# Patient Record
Sex: Male | Born: 1970 | Race: Black or African American | Hispanic: No | Marital: Married | Smoking: Current every day smoker
Health system: Southern US, Community
[De-identification: ages and names within clinical notes are randomized; demographics above are authoritative.]

## PROBLEM LIST (undated history)

## (undated) DIAGNOSIS — M1990 Unspecified osteoarthritis, unspecified site: Secondary | ICD-10-CM

## (undated) DIAGNOSIS — I10 Essential (primary) hypertension: Secondary | ICD-10-CM

## (undated) DIAGNOSIS — J45909 Unspecified asthma, uncomplicated: Secondary | ICD-10-CM

---

## 2013-03-07 ENCOUNTER — Encounter (HOSPITAL_COMMUNITY): Payer: Self-pay | Admitting: Emergency Medicine

## 2013-03-07 ENCOUNTER — Emergency Department (HOSPITAL_COMMUNITY)
Admission: EM | Admit: 2013-03-07 | Discharge: 2013-03-07 | Disposition: A | Discharge status: Home / Self Care | Payer: Medicare Other | Attending: Emergency Medicine | Admitting: Emergency Medicine

## 2013-03-07 ENCOUNTER — Emergency Department (HOSPITAL_COMMUNITY): Payer: Medicare Other

## 2013-03-07 DIAGNOSIS — M2391 Unspecified internal derangement of right knee: Secondary | ICD-10-CM

## 2013-03-07 DIAGNOSIS — Z791 Long term (current) use of non-steroidal anti-inflammatories (NSAID): Secondary | ICD-10-CM | POA: Insufficient documentation

## 2013-03-07 DIAGNOSIS — F17200 Nicotine dependence, unspecified, uncomplicated: Secondary | ICD-10-CM | POA: Insufficient documentation

## 2013-03-07 DIAGNOSIS — Z79899 Other long term (current) drug therapy: Secondary | ICD-10-CM | POA: Insufficient documentation

## 2013-03-07 DIAGNOSIS — J45909 Unspecified asthma, uncomplicated: Secondary | ICD-10-CM | POA: Insufficient documentation

## 2013-03-07 DIAGNOSIS — X500XXA Overexertion from strenuous movement or load, initial encounter: Secondary | ICD-10-CM | POA: Insufficient documentation

## 2013-03-07 DIAGNOSIS — Y929 Unspecified place or not applicable: Secondary | ICD-10-CM | POA: Insufficient documentation

## 2013-03-07 DIAGNOSIS — Y9301 Activity, walking, marching and hiking: Secondary | ICD-10-CM | POA: Insufficient documentation

## 2013-03-07 DIAGNOSIS — M1990 Unspecified osteoarthritis, unspecified site: Secondary | ICD-10-CM | POA: Insufficient documentation

## 2013-03-07 DIAGNOSIS — IMO0002 Reserved for concepts with insufficient information to code with codable children: Secondary | ICD-10-CM | POA: Insufficient documentation

## 2013-03-07 DIAGNOSIS — W108XXA Fall (on) (from) other stairs and steps, initial encounter: Secondary | ICD-10-CM | POA: Insufficient documentation

## 2013-03-07 HISTORY — DX: Unspecified osteoarthritis, unspecified site: M19.90

## 2013-03-07 HISTORY — DX: Unspecified asthma, uncomplicated: J45.909

## 2013-03-07 MED ORDER — OXYCODONE-ACETAMINOPHEN 5-325 MG PO TABS
2.0000 | ORAL_TABLET | Freq: Once | ORAL | Status: AC
  Administered 2013-03-07: 2 via ORAL
  Filled 2013-03-07: qty 2

## 2013-03-07 NOTE — ED Notes (Signed)
Pt given water 

## 2013-03-07 NOTE — ED Notes (Signed)
Patient transported to X-ray 

## 2013-03-07 NOTE — Discharge Instructions (Signed)

## 2013-03-07 NOTE — ED Notes (Signed)
Presents with right knee pain began after walking down stairs and hearing a POP, then the knee gave out. CMS intact. Pain rated 10/10

## 2013-03-07 NOTE — ED Provider Notes (Signed)
CSN: [id number]     Arrival date & time 03/07/13  2140 History  This chart was scribed for Bryan Helper, PA working with Shanna Cisco, MD by Quintella Reichert, ED Scribe. This patient was seen in room TR11C/TR11C and the patient's care was started at 11:01 PM.   Chief Complaint  Patient presents with  . Knee Pain    The history is provided by the patient. No language interpreter was used.    HPI Comments: Bryan Jordan is a 43 y.o. male with h/o degenerative joint disease who presents to the Emergency Department complaining of sudden-onset right knee pain that began 6.5 hours ago.  Pt states he had a meniscus tear surgery on that knee last year in [place] and has had some intermittent pain and "locking up" since then.  Today while he was walking down the stairs he had an episode of his knee locking up and he fell.  In the process of falling he heard a "pop."  He denies head impact or LOC.  He states that he immediately developed severe pain to the anterolateral aspect of the right knee with associated visible swelling.  He has not been able to bear any weight since then.  Pain is worsened by flexion.  Pt takes Percocet and meloxicam daily which are prescribed by his pain management specialist.  He took these today as usual but did not take anything extra for his pain today.  He denies ankle or hip pain.    Past Medical History  Diagnosis Date  . DJD (degenerative joint disease)   . Asthma     History reviewed. No pertinent past surgical history.  History reviewed. No pertinent family history.   History  Substance Use Topics  . Smoking status: Current Every Day Smoker    Types: Cigarettes  . Smokeless tobacco: Not on file  . Alcohol Use: Yes     Review of Systems  Musculoskeletal: Positive for arthralgias (Right knee) and joint swelling (Right knee).  Neurological: Negative for syncope.     Allergies  Review of patient's allergies indicates no known allergies.  Home  Medications   Current Outpatient Rx  Name  Route  Sig  Dispense  Refill  . albuterol (PROVENTIL HFA;VENTOLIN HFA) 108 (90 BASE) MCG/ACT inhaler   Inhalation   Inhale 2 puffs into the lungs every 6 (six) hours as needed for wheezing or shortness of breath.         Marland Kitchen albuterol (PROVENTIL) (2.5 MG/3ML) 0.083% nebulizer solution   Nebulization   Take 2.5 mg by nebulization every 6 (six) hours as needed for wheezing or shortness of breath.         . EPINEPHrine (EPIPEN 2-PAK) 0.3 mg/0.3 mL SOAJ injection   Intramuscular   Inject 0.3 mg into the muscle once.         . meloxicam (MOBIC) 15 MG tablet   Oral   Take 15 mg by mouth daily.         Marland Kitchen oxyCODONE-acetaminophen (PERCOCET/ROXICET) 5-325 MG per tablet   Oral   Take 1 tablet by mouth every 4 (four) hours as needed for moderate pain or severe pain.          BP 121/75  Pulse 95  Temp(Src) 98.4 F (36.9 C) (Oral)  Resp 18  SpO2 97%  Physical Exam  Nursing note and vitals reviewed. Constitutional: He is oriented to person, place, and time. He appears well-developed and well-nourished. No distress.  HENT:  Head:  Normocephalic and atraumatic.  Eyes: EOM are normal.  Neck: Neck supple. No tracheal deviation present.  Cardiovascular: Normal rate.   Pulmonary/Chest: Effort normal. No respiratory distress.  Musculoskeletal: He exhibits tenderness.  Moderate tenderness to anterolateral aspect of right knee, with moderate effusion noted, specifically to the suprapatellar region.  No obvious deformity noted.  Sensation intact.  Decreased ROM secondary to pain, especially with knee flexion.  Intact distal pulses.  Lower leg without palpable cords, erythema or edema.  Right hip and ankle normal.  Neurological: He is alert and oriented to person, place, and time.  Skin: Skin is warm and dry.  Psychiatric: He has a normal mood and affect. His behavior is normal.    ED Course  Procedures (including critical care  time)  DIAGNOSTIC STUDIES: Oxygen Saturation is 97% on room air, normal by my interpretation.    COORDINATION OF CARE: 11:07 PM-Discussed treatment plan which includes knee sleeve, crutches, continuing regular medication regimen, and orthopedic f/u with pt at bedside and pt agreed to plan.    Labs Review Labs Reviewed - No data to display  Imaging Review Dg Knee Complete 4 Views Right  03/07/2013   CLINICAL DATA:  Right knee pain.  Fall.  EXAM: RIGHT KNEE - COMPLETE 4+ VIEW  COMPARISON:  None.  FINDINGS: Moderate joint effusion is identified. No fracture or dislocation is seen. Small medial compartment osteophytes are noted.  IMPRESSION: Negative for fracture.  Moderate joint effusion.  Mild medial compartment degenerative change.   Electronically Signed   By: Drusilla Kannerhomas  Dalessio M.D.   On: 03/07/2013 22:23    EKG Interpretation   None       MDM   1. Acute internal derangement of right knee    BP 121/75  Pulse 95  Temp(Src) 98.4 F (36.9 C) (Oral)  Resp 18  SpO2 97%  I have reviewed nursing notes and vital signs. I personally reviewed the imaging tests through PACS system  I reviewed available ER/hospitalization records thought the EMR   I personally performed the services described in this documentation, which was scribed in my presence. The recorded information has been reviewed and is accurate.     Bryan HelperBowie Mirielle Byrum, PA-C 03/08/13 0130

## 2013-03-07 NOTE — ED Notes (Signed)
Ortho paged. 

## 2013-03-10 NOTE — ED Provider Notes (Signed)
Medical screening examination/treatment/procedure(s) were performed by non-physician practitioner and as supervising physician I was immediately available for consultation/collaboration.  EKG Interpretation   None         Shanna CiscoMegan E Docherty, MD 03/10/13 [phone]

## 2016-02-25 ENCOUNTER — Encounter (HOSPITAL_COMMUNITY): Payer: Self-pay | Admitting: Emergency Medicine

## 2016-02-25 ENCOUNTER — Emergency Department (HOSPITAL_COMMUNITY)
Admission: EM | Admit: 2016-02-25 | Discharge: 2016-02-25 | Disposition: A | Discharge status: Home / Self Care | Payer: Medicare Other | Attending: Emergency Medicine | Admitting: Emergency Medicine

## 2016-02-25 DIAGNOSIS — Z79899 Other long term (current) drug therapy: Secondary | ICD-10-CM | POA: Diagnosis not present

## 2016-02-25 DIAGNOSIS — L03211 Cellulitis of face: Secondary | ICD-10-CM | POA: Diagnosis not present

## 2016-02-25 DIAGNOSIS — F17210 Nicotine dependence, cigarettes, uncomplicated: Secondary | ICD-10-CM | POA: Insufficient documentation

## 2016-02-25 DIAGNOSIS — I10 Essential (primary) hypertension: Secondary | ICD-10-CM | POA: Diagnosis not present

## 2016-02-25 DIAGNOSIS — J45909 Unspecified asthma, uncomplicated: Secondary | ICD-10-CM | POA: Insufficient documentation

## 2016-02-25 DIAGNOSIS — R220 Localized swelling, mass and lump, head: Secondary | ICD-10-CM | POA: Diagnosis present

## 2016-02-25 HISTORY — DX: Essential (primary) hypertension: I10

## 2016-02-25 MED ORDER — DOXYCYCLINE HYCLATE 100 MG PO CAPS: 100.0000 mg | ORAL_CAPSULE | Freq: Two times a day (BID) | ORAL | 0 refills | Status: AC

## 2016-02-25 MED ORDER — DOXYCYCLINE HYCLATE 100 MG PO CAPS: 100.0000 mg | ORAL_CAPSULE | Freq: Two times a day (BID) | ORAL | 0 refills | Status: DC

## 2016-02-25 NOTE — ED Provider Notes (Signed)
[facility]-EMERGENCY DEPT Provider Note   CSN: [id number] Arrival date & time: 02/25/16  0808     History   Chief Complaint Chief Complaint  Patient presents with  . Facial Swelling    HPI Bryan Jordan is a 46 y.o. male with PMH HTN and Asthma who presents with right facial swelling which began today.    HPI Patient reports that he was asymptomatic yesterday but noticed swelling and redness on the right side of his nose and skin over right maxillary area. Denies popping pimple around that area. No dental pain. No sinus pressure/pain. No recent illness with URI symptoms. No shortness of breath. No sore throat or trouble swallowing. No recent dental procedures. No fevers at home. Symptoms are the same currently compared to when it started. No history of cellulitis.Reports of allergy to grass s/p immunotherapy.   Past Medical History:  Diagnosis Date  . Asthma   . DJD (degenerative joint disease)   . Hypertension     There are no active problems to display for this patient.   History reviewed. No pertinent surgical history.     Home Medications    Prior to Admission medications   Medication Sig Start Date End Date Taking? Authorizing Provider  ADVAIR DISKUS 100-50 MCG/DOSE AEPB Inhale 1 puff into the lungs daily. 01/29/16  Yes Historical Provider, MD  albuterol (PROVENTIL HFA;VENTOLIN HFA) 108 (90 BASE) MCG/ACT inhaler Inhale 2 puffs into the lungs every 6 (six) hours as needed for wheezing or shortness of breath.   Yes Historical Provider, MD  amLODipine (NORVASC) 10 MG tablet Take 10 mg by mouth daily. 01/29/16  Yes Historical Provider, MD  hydrochlorothiazide (HYDRODIURIL) 25 MG tablet Take 25 mg by mouth daily. 01/29/16  Yes Historical Provider, MD  meloxicam (MOBIC) 15 MG tablet Take 15 mg by mouth daily.   Yes Historical Provider, MD  oxyCODONE-acetaminophen (PERCOCET) 10-325 MG tablet Take 1 tablet by mouth every 6 (six) hours as needed for pain.   Yes Historical  Provider, MD  simvastatin (ZOCOR) 20 MG tablet Take 20 mg by mouth daily. 01/29/16  Yes Historical Provider, MD  albuterol (PROVENTIL) (2.5 MG/3ML) 0.083% nebulizer solution Take 2.5 mg by nebulization every 6 (six) hours as needed for wheezing or shortness of breath.    Historical Provider, MD  doxycycline (VIBRAMYCIN) 100 MG capsule Take 1 capsule (100 mg total) by mouth 2 (two) times daily. 02/25/16   Palma HolterKanishka G Gunadasa, MD  EPINEPHrine (EPIPEN 2-PAK) 0.3 mg/0.3 mL SOAJ injection Inject 0.3 mg into the muscle once.    Historical Provider, MD    Family History No family history on file.  Social History Social History  Substance Use Topics  . Smoking status: Current Every Day Smoker    Types: Cigarettes  . Smokeless tobacco: Never Used  . Alcohol use Yes     Allergies   Patient has no known allergies.   Review of Systems Review of Systems  Constitutional: Negative for chills and fever.  HENT: Positive for facial swelling. Negative for drooling, ear pain, rhinorrhea, sinus pain, sinus pressure, sore throat, trouble swallowing and voice change.   Respiratory: Negative for cough and shortness of breath.      Physical Exam Updated Vital Signs BP (!) 136/106   Pulse 67   Temp 98.9 F (37.2 C) (Oral)   Resp 18   Ht 5\' 6"  (1.676 m)   Wt 76.7 kg   SpO2 98%   BMI 27.28 kg/m   Physical Exam  Constitutional:  He is oriented to person, place, and time. He appears well-developed and well-nourished. No distress.  HENT:  Head: Normocephalic and atraumatic.  Mouth/Throat: Oropharynx is clear and moist. No oropharyngeal exudate.  Swelling of the right nare and of skin on the right maxillary area beside nose with associated mild erythema. Tenderness to palpation of this area. No fluctuance noted. Used bedside ultrasound without a focal fluid collection noted. No pain with palpation of gums. Uvula midline   Cardiovascular: Normal rate and regular rhythm.  Exam reveals no gallop and  no friction rub.   No murmur heard. Pulmonary/Chest: Effort normal and breath sounds normal.  Abdominal: Soft. There is no tenderness.  Neurological: He is alert and oriented to person, place, and time. No cranial nerve deficit.  Skin: Skin is warm and dry. Capillary refill takes less than 2 seconds.   ED Treatments / Results  Labs (all labs ordered are listed, but only abnormal results are displayed) Labs Reviewed - No data to display  EKG  EKG Interpretation None       Radiology No results found.  Procedures Procedures (including critical care time)  Medications Ordered in ED Medications - No data to display   Initial Impression / Assessment and Plan / ED Course  I have reviewed the triage vital signs and the nursing notes.  Pertinent labs & imaging results that were available during my care of the patient were reviewed by me and considered in my medical decision making (see chart for details).  Patient with symptoms consistent with right facial cellulitis on R nose and maxillary area. No orbital or periorbital cellulitis noted on exam.  No fluctuance noted on exam; no focal fluid collection noted on bedside ultrasound. Recommended warm compresses and Doxycycline 100mg  BID x 5 days. Recommended follow up with PCP in about 3 days to ensure symptoms resolving on antibiotic. ED return precautions discussed.   Final Clinical Impressions(s) / ED Diagnoses   Final diagnoses:  Cellulitis of face    New Prescriptions Discharge Medication List as of 02/25/2016 12:33 PM       Palma Holter, MD 02/25/16 1406    Melene Plan, DO 02/25/16 1410

## 2016-02-25 NOTE — Discharge Instructions (Signed)
Your symptoms are likely consistent with cellulitis Please apply warm compresses to the area four times a day. Try not to squeeze the area. Please take the antibiotic as prescribed for 5 days. Please return to the ED if your symptoms worsen and/or if it spreads to your eye. Please follow up with your PCP to ensure your symptoms are improving.

## 2016-02-25 NOTE — ED Triage Notes (Signed)
Facial swelling  Since this am , from nose up to under rt eye , no difficulty breathing , handling secreations well , stataes is on HCTZ but has not taken it in a while

## 2016-02-25 NOTE — ED Notes (Signed)
ED Provider at bedside. 

## 2016-02-25 NOTE — ED Notes (Signed)
Pt reports he is due for 10/325mg  percocets that he takes Q4 for knee arthritis. MD informed

## 2017-05-03 ENCOUNTER — Other Ambulatory Visit: Payer: Self-pay

## 2017-05-03 ENCOUNTER — Encounter (HOSPITAL_COMMUNITY): Payer: Self-pay

## 2017-05-03 ENCOUNTER — Ambulatory Visit (HOSPITAL_COMMUNITY)
Admission: EM | Admit: 2017-05-03 | Discharge: 2017-05-03 | Disposition: A | Discharge status: Home / Self Care | Payer: Medicare Other | Attending: Family Medicine | Admitting: Family Medicine

## 2017-05-03 DIAGNOSIS — S39012A Strain of muscle, fascia and tendon of lower back, initial encounter: Secondary | ICD-10-CM | POA: Diagnosis not present

## 2017-05-03 MED ORDER — CYCLOBENZAPRINE HCL 10 MG PO TABS: 10.0000 mg | ORAL_TABLET | Freq: Two times a day (BID) | ORAL | 0 refills | Status: AC | PRN

## 2017-05-03 MED ORDER — METHYLPREDNISOLONE 4 MG PO TABS: 4.0000 mg | ORAL_TABLET | Freq: Two times a day (BID) | ORAL | 0 refills | Status: AC

## 2017-05-03 NOTE — ED Triage Notes (Signed)
Pt presents with complaints of lower back pain since Thursday that shoots down his left leg with tingling in his left foot. Pt denies any injury.

## 2017-05-03 NOTE — ED Provider Notes (Signed)
[facility]   [id number] 05/03/17 Arrival Time: 1001   SUBJECTIVE:  Bryan Jordan is a 47 y.o. male who presents to the urgent care with complaint of lower back pain since Thursday that shoots down his left leg with tingling in his left foot. Pt denies any injury.  Patient denies fever, injury, urinary incontinence.  Patient has a long history of "degenerative joint disease" since 2012 he has been disabled.  He goes to a local pain clinic for chronic pain.    Past Medical History:  Diagnosis Date  . Asthma   . DJD (degenerative joint disease)   . Hypertension    No family history on file. Social History   Socioeconomic History  . Marital status: Married    Spouse name: Not on file  . Number of children: Not on file  . Years of education: Not on file  . Highest education level: Not on file  Occupational History  . Not on file  Social Needs  . Financial resource strain: Not on file  . Food insecurity:    Worry: Not on file    Inability: Not on file  . Transportation needs:    Medical: Not on file    Non-medical: Not on file  Tobacco Use  . Smoking status: Current Every Day Smoker    Types: Cigarettes  . Smokeless tobacco: Never Used  Substance and Sexual Activity  . Alcohol use: Yes  . Drug use: No  . Sexual activity: Not on file  Lifestyle  . Physical activity:    Days per week: Not on file    Minutes per session: Not on file  . Stress: Not on file  Relationships  . Social connections:    Talks on phone: Not on file    Gets together: Not on file    Attends religious service: Not on file    Active member of club or organization: Not on file    Attends meetings of clubs or organizations: Not on file    Relationship status: Not on file  . Intimate partner violence:    Fear of current or ex partner: Not on file    Emotionally abused: Not on file    Physically abused: Not on file    Forced sexual activity: Not on file  Other Topics Concern  .  Not on file  Social History Narrative  . Not on file   Current Meds  Medication Sig  . ADVAIR DISKUS 100-50 MCG/DOSE AEPB Inhale 1 puff into the lungs daily.  Marland Kitchen albuterol (PROVENTIL HFA;VENTOLIN HFA) 108 (90 BASE) MCG/ACT inhaler Inhale 2 puffs into the lungs every 6 (six) hours as needed for wheezing or shortness of breath.  Marland Kitchen albuterol (PROVENTIL) (2.5 MG/3ML) 0.083% nebulizer solution Take 2.5 mg by nebulization every 6 (six) hours as needed for wheezing or shortness of breath.  Marland Kitchen amLODipine (NORVASC) 10 MG tablet Take 10 mg by mouth daily.  Marland Kitchen EPINEPHrine (EPIPEN 2-PAK) 0.3 mg/0.3 mL SOAJ injection Inject 0.3 mg into the muscle once.  . hydrochlorothiazide (HYDRODIURIL) 25 MG tablet Take 25 mg by mouth daily.  . meloxicam (MOBIC) 15 MG tablet Take 15 mg by mouth daily.  Marland Kitchen oxyCODONE-acetaminophen (PERCOCET) 10-325 MG tablet Take 1 tablet by mouth every 6 (six) hours as needed for pain.  . simvastatin (ZOCOR) 20 MG tablet Take 20 mg by mouth daily.   No Known Allergies    ROS: As per HPI, remainder of ROS negative.   OBJECTIVE:   Vitals:  05/03/17 1020  BP: (!) 156/98  Pulse: 75  Resp: 18  Temp: 98.6 F (37 C)  SpO2: 100%  Weight: 179 lb (81.2 kg)  Height: 5\' 6"  (1.676 m)     General appearance: alert; no distress Eyes: PERRL; EOMI; conjunctiva normal HENT: normocephalic; atraumatic; ; oral mucosa normal Neck: supple Abdomen: soft, non-tender; bowel sounds normal; no masses or organomegaly; no guarding or rebound tenderness Back: no CVA tenderness; tender at the base of the left lumbar spine Extremities: no cyanosis or edema; symmetrical with no gross deformities; pain in the left lumbar region with straight leg raising Skin: warm and dry Neurologic: normal gait; grossly normal Psychological: alert and cooperative; normal mood and affect      Labs:  No results found for this or any previous visit.  Labs Reviewed - No data to display  No results  found.     ASSESSMENT & PLAN:  1. Strain of lumbar region, initial encounter     Meds ordered this encounter  Medications  . methylPREDNISolone (MEDROL) 4 MG tablet    Sig: Take 1 tablet (4 mg total) by mouth 2 (two) times daily.    Dispense:  10 tablet    Refill:  0  . cyclobenzaprine (FLEXERIL) 10 MG tablet    Sig: Take 1 tablet (10 mg total) by mouth 2 (two) times daily as needed for muscle spasms.    Dispense:  20 tablet    Refill:  0    Reviewed expectations re: course of current medical issues. Questions answered. Outlined signs and symptoms indicating need for more acute intervention. Patient verbalized understanding. After Visit Summary given.    Procedures:      Elvina SidleLauenstein, Cesilia Shinn, MD 05/03/17 1036

## 2017-11-16 ENCOUNTER — Other Ambulatory Visit: Payer: Self-pay

## 2017-11-16 ENCOUNTER — Emergency Department (HOSPITAL_COMMUNITY)
Admission: EM | Admit: 2017-11-16 | Discharge: 2017-11-17 | Disposition: A | Discharge status: Home / Self Care | Payer: Medicare Other | Attending: Emergency Medicine | Admitting: Emergency Medicine

## 2017-11-16 ENCOUNTER — Encounter (HOSPITAL_COMMUNITY): Payer: Self-pay | Admitting: *Deleted

## 2017-11-16 DIAGNOSIS — F17210 Nicotine dependence, cigarettes, uncomplicated: Secondary | ICD-10-CM | POA: Diagnosis not present

## 2017-11-16 DIAGNOSIS — Z79899 Other long term (current) drug therapy: Secondary | ICD-10-CM | POA: Insufficient documentation

## 2017-11-16 DIAGNOSIS — J45909 Unspecified asthma, uncomplicated: Secondary | ICD-10-CM | POA: Insufficient documentation

## 2017-11-16 DIAGNOSIS — R2231 Localized swelling, mass and lump, right upper limb: Secondary | ICD-10-CM | POA: Diagnosis present

## 2017-11-16 DIAGNOSIS — L02412 Cutaneous abscess of left axilla: Secondary | ICD-10-CM | POA: Insufficient documentation

## 2017-11-16 DIAGNOSIS — I10 Essential (primary) hypertension: Secondary | ICD-10-CM | POA: Insufficient documentation

## 2017-11-16 DIAGNOSIS — L02419 Cutaneous abscess of limb, unspecified: Secondary | ICD-10-CM

## 2017-11-16 DIAGNOSIS — L02411 Cutaneous abscess of right axilla: Secondary | ICD-10-CM | POA: Diagnosis not present

## 2017-11-16 MED ORDER — LIDOCAINE-EPINEPHRINE (PF) 2 %-1:200000 IJ SOLN
10.0000 mL | Freq: Once | INTRAMUSCULAR | Status: AC
  Administered 2017-11-16: 10 mL
  Filled 2017-11-16: qty 20

## 2017-11-16 NOTE — ED Notes (Signed)
Pt has abscess under both arms, see provider's assessment, pt fast track pt.

## 2017-11-16 NOTE — ED Triage Notes (Signed)
Pt has two abscesses under R axilla and another one under L axilla. No drainage, just pain.

## 2017-11-16 NOTE — ED Provider Notes (Signed)
[facility] EMERGENCY DEPARTMENT Provider Note   CSN: [id number] Arrival date & time: 11/16/17  2122     History   Chief Complaint Chief Complaint  Patient presents with  . Abscess    HPI Bryan Jordan is a 47 y.o. male with a history of asthma, DJD, and HTN who presents to the emergency department with a chief complaint of bilateral axillary pain and swelling that has progressively worsened on for the last 2 days, R>L.  No history of similar.  His pain significantly worsened last night and had a sleep of his right arm over his head due to the pain.  He denies fever or chills.  He denies recent shaving, wearing tight shirts, increased friction to the axillary area.  He states that his wife has had recurrent similar symptoms and advised him to stop wearing deodorant.  No history of diabetes mellitus.  The history is provided by the patient. No language interpreter was used.    Past Medical History:  Diagnosis Date  . Asthma   . DJD (degenerative joint disease)   . Hypertension     There are no active problems to display for this patient.   History reviewed. No pertinent surgical history.      Home Medications    Prior to Admission medications   Medication Sig Start Date End Date Taking? Authorizing Provider  ADVAIR DISKUS 100-50 MCG/DOSE AEPB Inhale 1 puff into the lungs daily. 01/29/16   [provider]  albuterol (PROVENTIL HFA;VENTOLIN HFA) 108 (90 BASE) MCG/ACT inhaler Inhale 2 puffs into the lungs every 6 (six) hours as needed for wheezing or shortness of breath.    [provider]  albuterol (PROVENTIL) (2.5 MG/3ML) 0.083% nebulizer solution Take 2.5 mg by nebulization every 6 (six) hours as needed for wheezing or shortness of breath.    [provider]  amLODipine (NORVASC) 10 MG tablet Take 10 mg by mouth daily. 01/29/16   [provider]  cyclobenzaprine (FLEXERIL) 10 MG tablet Take 1 tablet (10 mg total)  by mouth 2 (two) times daily as needed for muscle spasms. 05/03/17   Elvina Sidle, MD  doxycycline (VIBRAMYCIN) 100 MG capsule Take 1 capsule (100 mg total) by mouth 2 (two) times daily. 02/25/16   Palma Holter, MD  EPINEPHrine (EPIPEN 2-PAK) 0.3 mg/0.3 mL SOAJ injection Inject 0.3 mg into the muscle once.    [provider]  hydrochlorothiazide (HYDRODIURIL) 25 MG tablet Take 25 mg by mouth daily. 01/29/16   [provider]  meloxicam (MOBIC) 15 MG tablet Take 15 mg by mouth daily.    [provider]  methylPREDNISolone (MEDROL) 4 MG tablet Take 1 tablet (4 mg total) by mouth 2 (two) times daily. 05/03/17   Elvina Sidle, MD  oxyCODONE-acetaminophen (PERCOCET) 10-325 MG tablet Take 1 tablet by mouth every 6 (six) hours as needed for pain.    [provider]  simvastatin (ZOCOR) 20 MG tablet Take 20 mg by mouth daily. 01/29/16   [provider]  sulfamethoxazole-trimethoprim (BACTRIM DS,SEPTRA DS) 800-160 MG tablet Take 1 tablet by mouth 2 (two) times daily for 7 days. 11/17/17 11/24/17  Michaela Shankel, Pedro Earls A, PA-C    Family History No family history on file.  Social History Social History   Tobacco Use  . Smoking status: Current Every Day Smoker    Types: Cigarettes  . Smokeless tobacco: Never Used  Substance Use Topics  . Alcohol use: Yes  . Drug use: No  Allergies   Patient has no known allergies.   Review of Systems Review of Systems  Constitutional: Negative for activity change, chills and fever.  Respiratory: Negative for shortness of breath.   Cardiovascular: Negative for chest pain.  Gastrointestinal: Negative for abdominal pain.  Musculoskeletal: Negative for back pain.  Skin: Positive for wound. Negative for color change and rash.  Neurological: Negative for weakness and numbness.     Physical Exam Updated Vital Signs BP (!) 154/86   Pulse 86   Temp 98.6 F (37 C) (Oral)   Resp 16   SpO2 96%   Physical  Exam  Constitutional: He appears well-developed.  HENT:  Head: Normocephalic.  Eyes: Conjunctivae are normal.  Neck: Neck supple.  Cardiovascular: Normal rate and regular rhythm.  No murmur heard. Pulmonary/Chest: Effort normal.  Abdominal: Soft. He exhibits no distension.  Musculoskeletal:  There are 2 large fluctuant areas with induration to the right axilla.  One area is superior and the other is inferior.  No active drainage to the area, but there appears to be an area that may soon be draining purulent material.  The area is moderately tender to palpation.  There are 3 circular, small, fluctuant areas noted to the left axilla.  No active drainage or drainage able to be expressed.  Mildly tender to palpation  No palpable lymphadenopathy to the bilateral axilla or supraclavicular regions.  Neurological: He is alert.  Skin: Skin is warm and dry.  Psychiatric: His behavior is normal.  Nursing note and vitals reviewed.    ED Treatments / Results  Labs (all labs ordered are listed, but only abnormal results are displayed) Labs Reviewed  CBG MONITORING, ED    EKG None  Radiology No results found.  Procedures .Marland KitchenIncision and Drainage Date/Time: 11/17/2017 2:57 AM Performed by: Barkley Boards, PA-C Authorized by: Barkley Boards, PA-C   Consent:    Consent obtained:  Verbal   Consent given by:  Patient   Risks discussed:  Bleeding, incomplete drainage, pain, infection and damage to other organs   Alternatives discussed:  No treatment, observation and alternative treatment Location:    Type:  Abscess   Size:  3x1 cm   Location: right axilla, inferior. Pre-procedure details:    Skin preparation:  Antiseptic wash Anesthesia (see MAR for exact dosages):    Anesthesia method:  Local infiltration   Local anesthetic:  Lidocaine 2% WITH epi Procedure type:    Complexity:  Simple Procedure details:    Needle aspiration: no     Incision types:  Single straight    Incision depth:  Dermal   Scalpel blade:  10   Wound management:  Probed and deloculated, irrigated with saline and extensive cleaning   Drainage:  Bloody and purulent   Drainage amount:  Moderate   Wound treatment:  Wound left open   Packing materials:  None Post-procedure details:    Patient tolerance of procedure:  Tolerated well, no immediate complications .Marland KitchenIncision and Drainage Date/Time: 11/17/2017 2:58 AM Performed by: Barkley Boards, PA-C Authorized by: Barkley Boards, PA-C   Consent:    Consent obtained:  Verbal   Consent given by:  Patient   Risks discussed:  Bleeding, incomplete drainage, pain, damage to other organs and infection Location:    Type:  Abscess   Size:  3x2 cm   Location: right axilla, superior. Pre-procedure details:    Skin preparation:  Antiseptic wash Anesthesia (see MAR for exact dosages):    Anesthesia method:  Local infiltration   Local anesthetic:  Lidocaine 2% WITH epi Procedure type:    Complexity:  Simple Procedure details:    Needle aspiration: no     Incision types:  Single straight   Incision depth:  Dermal   Scalpel blade:  10   Wound management:  Probed and deloculated, irrigated with saline and extensive cleaning   Drainage:  Bloody and purulent   Drainage amount:  Moderate   Wound treatment:  Wound left open   Packing materials:  None Post-procedure details:    Patient tolerance of procedure:  Tolerated well, no immediate complications   (including critical care time)  Medications Ordered in ED Medications  lidocaine-EPINEPHrine (XYLOCAINE W/EPI) 2 %-1:200000 (PF) injection 10 mL (10 mLs Infiltration Given 11/16/17 2356)     Initial Impression / Assessment and Plan / ED Course  I have reviewed the triage vital signs and the nursing notes.  Pertinent labs & imaging results that were available during my care of the patient were reviewed by me and considered in my medical decision making (see chart for details).      47 year old male with a history of asthma, DJD, and HTN today with multiple abscesses to the bilateral axillae.  No history of similar.  No constitutional symptoms and he does not appear toxic or septic.  No history of diabetes mellitus or other immunocompromising states.  CBG was checked by point-of-care given new pathology, which was 88.  He denies shaving or increased friction to the area or other exacerbating factors.  Patient with skin abscess amenable to incision and drainage.  Abscess was not large enough to warrant packing or drain,  wound recheck in 2 days. Encouraged home warm soaks and flushing.  Mild signs of cellulitis is surrounding skin.  Will d/c to home.  Areas to be left axilla are not amenable to incision and drainage.  Will send the patient home with a course of Bactrim to cover for MRSA given multiple abscesses.  He is also been given a referral to general surgery.  Strict return precautions given.  He is hemodynamically stable and in no acute distress.  He is safe for discharge to home with outpatient follow-up at this time.  Final Clinical Impressions(s) / ED Diagnoses   Final diagnoses:  Axillary abscess    ED Discharge Orders         Ordered    sulfamethoxazole-trimethoprim (BACTRIM DS,SEPTRA DS) 800-160 MG tablet  2 times daily     11/17/17 0040           Chariah Bailey A, PA-C 11/17/17 0302    Shon Baton, MD 11/17/17 [phone]

## 2017-11-17 DIAGNOSIS — L02411 Cutaneous abscess of right axilla: Secondary | ICD-10-CM | POA: Diagnosis not present

## 2017-11-17 LAB — CBG MONITORING, ED: GLUCOSE-CAPILLARY: 88 mg/dL (ref 70–99)

## 2017-11-17 MED ORDER — SULFAMETHOXAZOLE-TRIMETHOPRIM 800-160 MG PO TABS: 1.0000 | ORAL_TABLET | Freq: Two times a day (BID) | ORAL | 0 refills | Status: AC

## 2017-11-17 NOTE — Discharge Instructions (Signed)
Thank you for allowing me to care for you today in the Emergency Department.   To care for the area at home, keep the area clean with warm water and soap.  Wash the area at least once daily.  Then, pat the area dry.  You can then apply a topical antibiotic such as bacitracin or Neosporin.  Then, apply a gauze dressing to the area.  Continue to reapply a gauze dressing at least once daily until the area stops draining.  Take 1 tablet of Bactrim 2 times daily for the next 7 days.  Apply warm compresses for 15 minutes up to 3-4 times daily to either armpit to help with pain and swelling.  You can take your home pain medication.  He should avoid shaving.  Also avoid tight fitting close that may cause friction to the area.   If you continue to have swelling that does not resolve despite taking the antibiotics, you can call and schedule an appointment with Dr. Derrell Lolling with general surgery.  His contact information is listed above.  Return to the emergency department if you develop new or worsening symptoms including fever, chills, worsening pain, swelling, redness, and drainage from infection in the armpits or other areas, or other new, concerning symptoms.

## 2018-04-15 ENCOUNTER — Ambulatory Visit
Admission: RE | Admit: 2018-04-15 | Discharge: 2018-04-15 | Disposition: A | Discharge status: Home / Self Care | Payer: Medicare Other | Source: Ambulatory Visit | Attending: Anesthesiology | Admitting: Anesthesiology

## 2018-04-15 ENCOUNTER — Other Ambulatory Visit: Payer: Self-pay | Admitting: Anesthesiology

## 2018-04-15 DIAGNOSIS — M170 Bilateral primary osteoarthritis of knee: Secondary | ICD-10-CM

## 2020-11-13 IMAGING — DX LEFT KNEE - 1-2 VIEW
2 series · 2 of 2 positions shown · non-contrast
Comparison: None available

CLINICAL DATA: Chronic bilat knee pains, past hx of old injury to
the Rt knee, no injury now

EXAM:
LEFT KNEE - 1-2 VIEW

[dg knee 1-2 views left (1 of 2)]
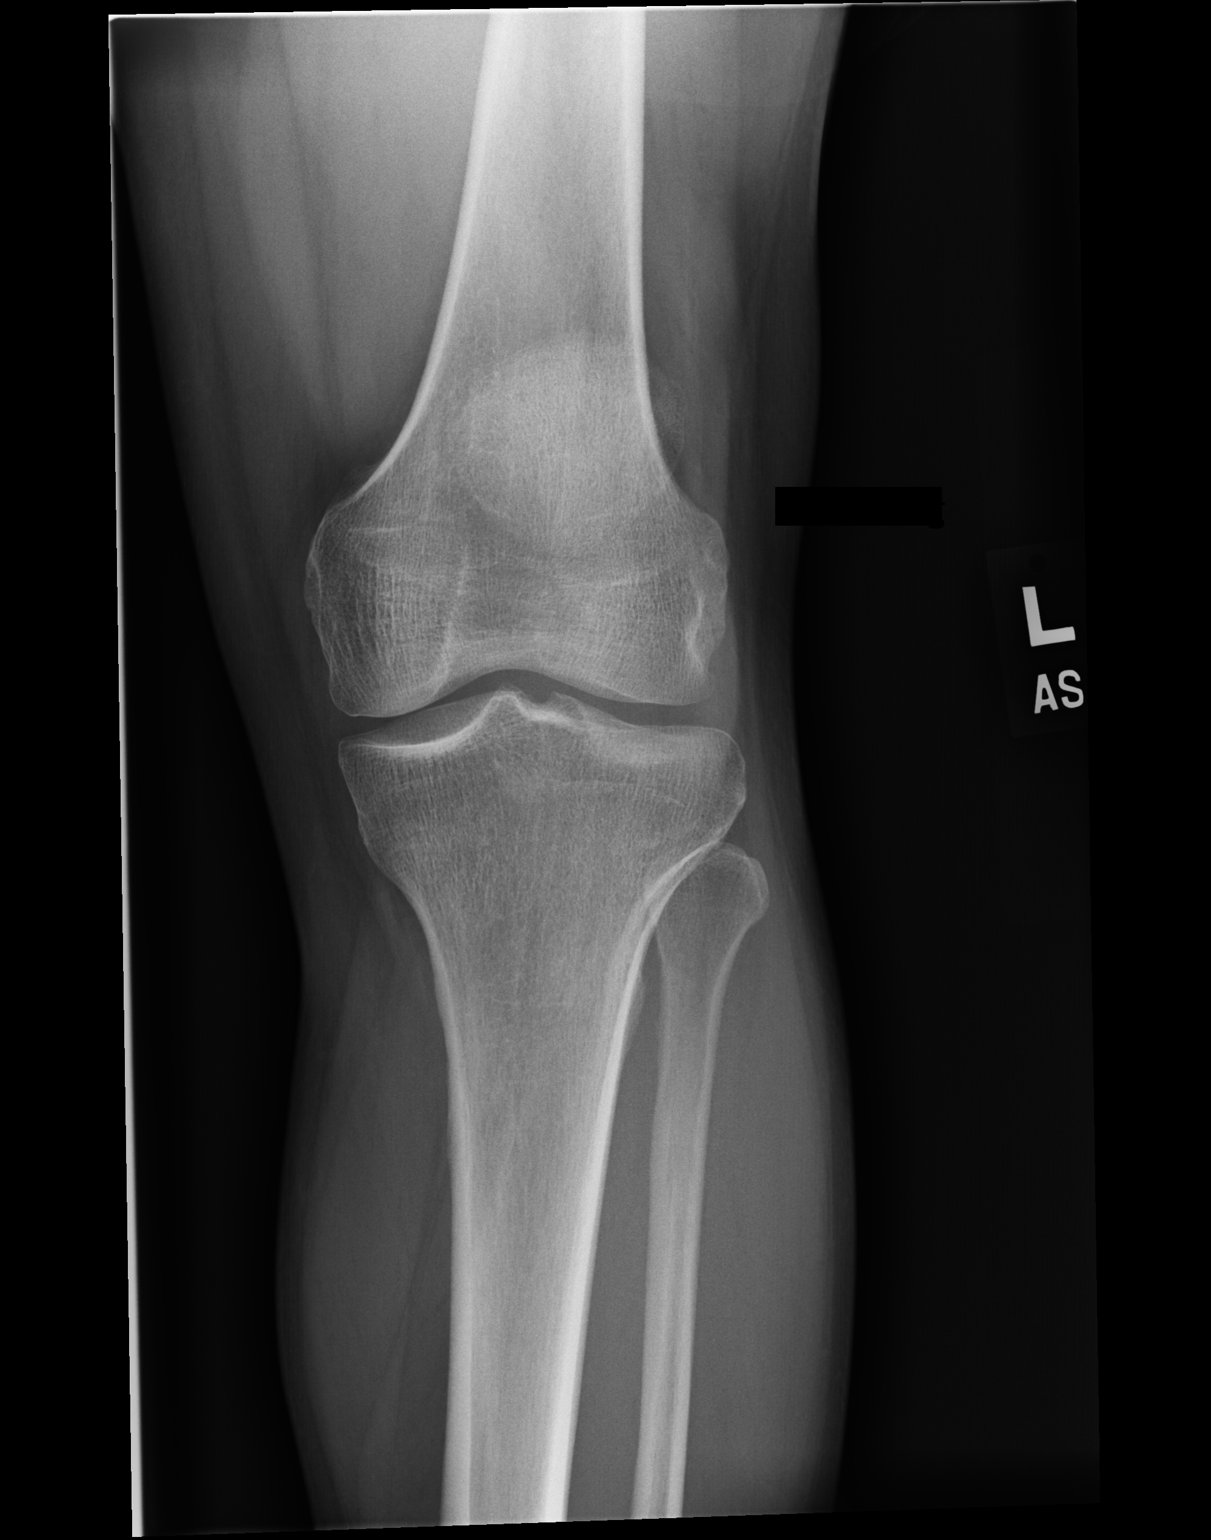

[dg knee 1-2 views left (2 of 2)]
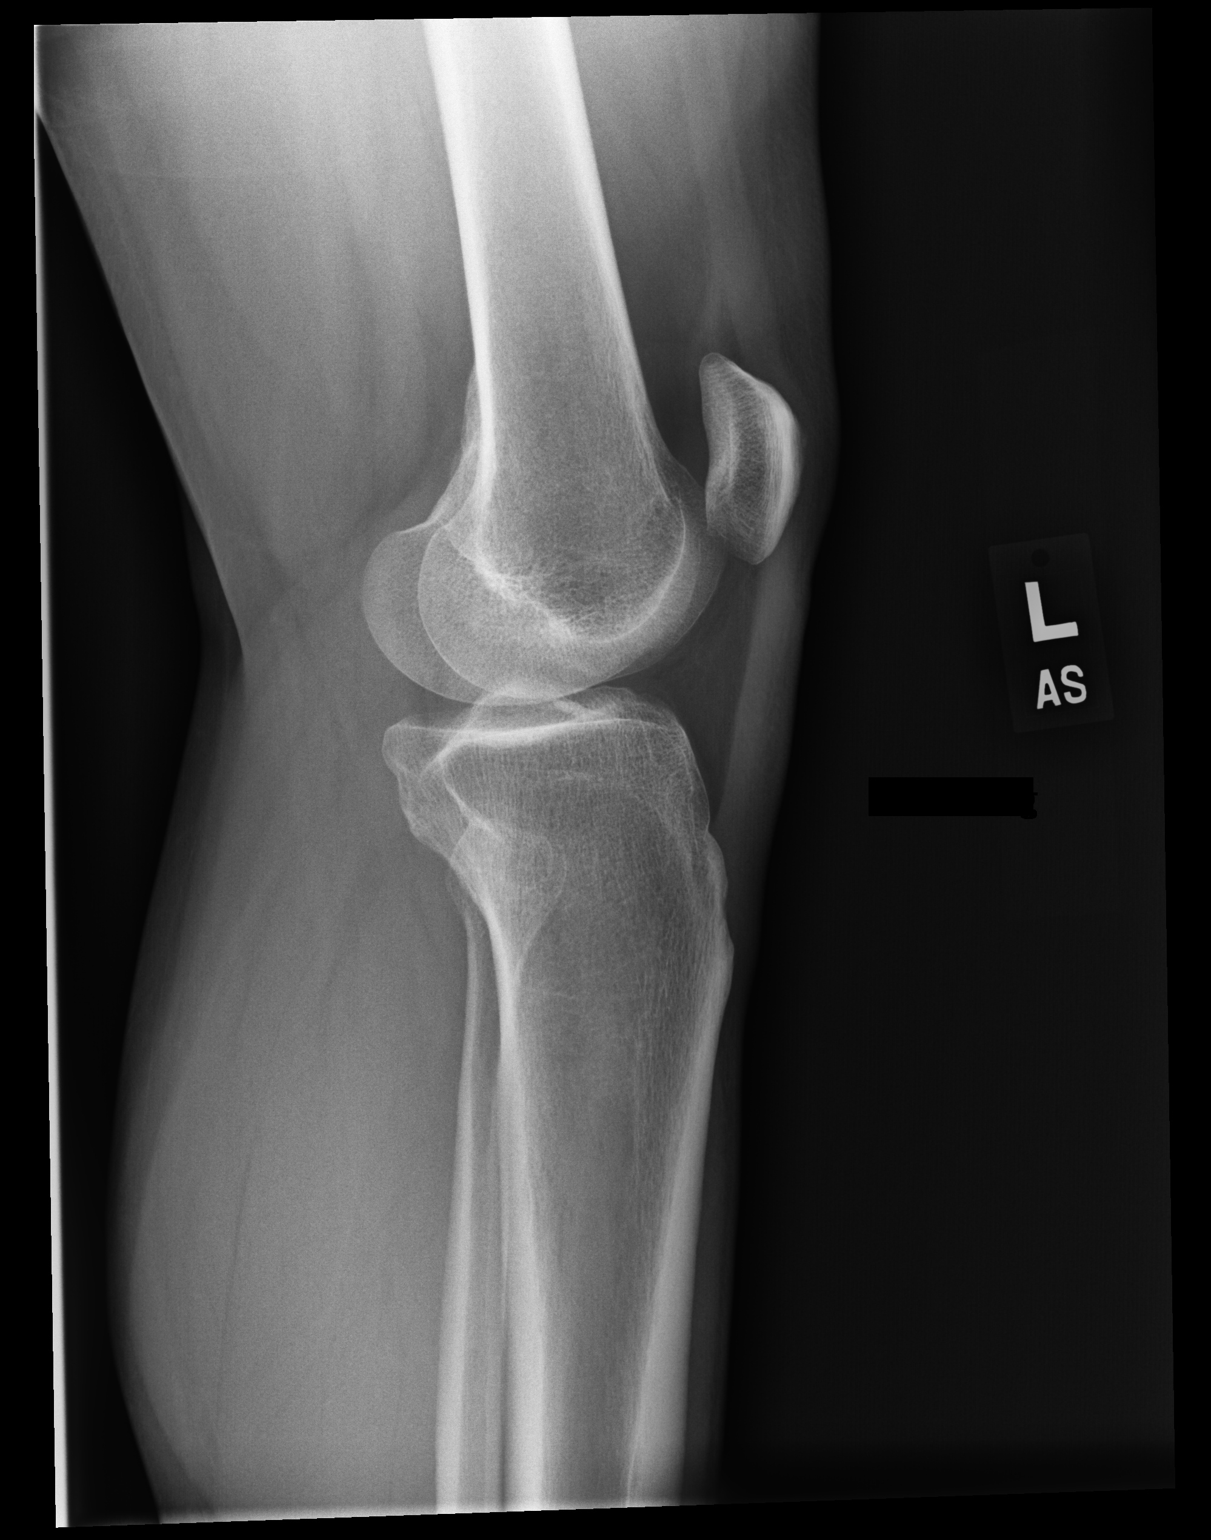

[2 of 2 positions shown; findings below may reference images not displayed]

FINDINGS: No evidence of fracture, dislocation, or joint effusion. No evidence
of arthropathy or other focal bone abnormality. Soft tissues are
unremarkable.
IMPRESSION: Negative.

## 2021-04-09 ENCOUNTER — Ambulatory Visit
Admission: RE | Admit: 2021-04-09 | Discharge: 2021-04-09 | Disposition: A | Discharge status: Home / Self Care | Payer: Medicare Other | Source: Ambulatory Visit | Attending: Anesthesiology | Admitting: Anesthesiology

## 2021-04-09 ENCOUNTER — Other Ambulatory Visit: Payer: Self-pay | Admitting: Anesthesiology

## 2021-04-09 DIAGNOSIS — M5136 Other intervertebral disc degeneration, lumbar region: Secondary | ICD-10-CM

## 2021-04-11 ENCOUNTER — Ambulatory Visit (HOSPITAL_COMMUNITY)
Admission: EM | Admit: 2021-04-11 | Discharge: 2021-04-11 | Disposition: A | Discharge status: Home / Self Care | Payer: Medicare Other | Attending: Psychiatry | Admitting: Psychiatry

## 2021-04-11 DIAGNOSIS — Z0389 Encounter for observation for other suspected diseases and conditions ruled out: Secondary | ICD-10-CM | POA: Diagnosis present

## 2021-04-11 NOTE — ED Provider Notes (Signed)
Behavioral Health Urgent Care Medical Screening Exam ? ?Patient Name: Maxamillian Tienda ?MRN: [id number] ?Date of Evaluation: 04/11/21 ?Chief Complaint:   ?Diagnosis:  ?Final diagnoses:  ?No psychiatric disorder found after evaluation  ? ? ?History of Present illness: Dylin Breeden is a 51 y.o. male. Patient presents voluntarily to [facility] behavioral health for walk-in assessment.  ? ?Jomo reports he was directed by his pain management provider, Dr. Alden Hipp, at [facility] on [address] to seek substance use evaluation today.  Patient reports he is prescribed Percocet by pain management provider for approximately 8 years.  Patient reports his urine drug screen reflected positive for hydrocodone over the last 2 months.  Patient reports he has only used medications as prescribed, no additional medications.  He denies alcohol and substance use. ? ?Patient is assessed face-to-face by nurse practitioner.  He is seated in assessment area, no acute distress.  He is alert and oriented, pleasant and cooperative during assessment.  ? ?Corbyn is not linked with outpatient psychiatry.  He denies any mental health history personally.  He denies any family history of mental health diagnoses.  He denies any history of inpatient psychiatric hospitalizations, denies any history of residential substance use treatment. ? ?He shares that he has chronic back, knee, and shoulder pain.  He has been compliant with medications as prescribed by pain management provider for approximately 8 years. ?He presents with euthymic mood, congruent affect. He denies suicidal and homicidal ideations.  He denies history of suicide attempts, denies history of self-harm.  He contracts verbally for safety with this Clinical research associate. ? He has normal speech and behavior.  He denies auditory and visual hallucinations.  Patient is able to converse coherently with goal-directed thoughts and no distractibility or preoccupation.  He denies paranoia.   Objectively there is no evidence of psychosis/mania or delusional thinking. ? ?Ossie resides in [place] with his wife and three youngest children. He has four adult children who reside outside of the home.  He denies access to weapons.  He is not currently employed.  Patient endorses average sleep and appetite. ? ?Patient offered support and encouragement. ? ? ?Psychiatric Specialty Exam ? ?Presentation  ?General Appearance:Appropriate for Environment; Casual ? ?Eye Contact:Good ? ?Speech:Clear and Coherent; Normal Rate ? ?Speech Volume:Normal ? ?Handedness:Right ? ? ?Mood and Affect  ?Mood:Euthymic ? ?Affect:Appropriate; Congruent ? ? ?Thought Process  ?Thought Processes:Coherent; Goal Directed; Linear ? ?Descriptions of Associations:Intact ? ?Orientation:Full (Time, Place and Person) ? ?Thought Content:Logical ?   Hallucinations:None ? ?Ideas of Reference:None ? ?Suicidal Thoughts:No ? ?Homicidal Thoughts:No ? ? ?Sensorium  ?Memory:Immediate Good; Recent Good ? ?Judgment:Good ? ?Insight:Good ? ? ?Executive Functions  ?Concentration:Good ? ?Attention Span:Good ? ?Recall:Good ? ?Fund of Knowledge:Good ? ?Language:Good ? ? ?Psychomotor Activity  ?Psychomotor Activity:Normal ? ? ?Assets  ?Assets:Communication Skills; Desire for Improvement; Financial Resources/Insurance; Housing; Intimacy; Leisure Time; Physical Health; Resilience; Social Support ? ? ?Sleep  ?Sleep:Poor ? ?Number of hours: No data recorded ? ?No data recorded ? ?Physical Exam: ?Physical Exam ?Vitals and nursing note reviewed.  ?Constitutional:   ?   Appearance: Normal appearance. He is well-developed.  ?HENT:  ?   Head: Normocephalic and atraumatic.  ?   Nose: Nose normal.  ?Cardiovascular:  ?   Rate and Rhythm: Normal rate.  ?Pulmonary:  ?   Effort: Pulmonary effort is normal.  ?Musculoskeletal:     ?   General: Normal range of motion.  ?   Cervical back: Normal range of motion.  ?Skin: ?  General: Skin is warm and dry.  ?Neurological:  ?    Mental Status: He is alert and oriented to person, place, and time.  ?Psychiatric:     ?   Attention and Perception: Attention and perception normal.     ?   Mood and Affect: Mood and affect normal.     ?   Speech: Speech normal.     ?   Behavior: Behavior normal. Behavior is cooperative.     ?   Thought Content: Thought content normal.     ?   Cognition and Memory: Cognition and memory normal.     ?   Judgment: Judgment normal.  ? ?Review of Systems  ?Constitutional: Negative.   ?HENT: Negative.    ?Eyes: Negative.   ?Respiratory: Negative.    ?Cardiovascular: Negative.   ?Gastrointestinal: Negative.   ?Genitourinary: Negative.   ?Musculoskeletal:  Positive for back pain and joint pain.  ?Skin: Negative.   ?Neurological: Negative.   ?Endo/Heme/Allergies: Negative.   ?Psychiatric/Behavioral: Negative.    ?Blood pressure (!) 130/103, pulse 92, temperature 98.5 ?F (36.9 ?C), temperature source Oral, resp. rate 18, SpO2 98 %. There is no height or weight on file to calculate BMI. ? ?Musculoskeletal: ?Strength & Muscle Tone: within normal limits ?Gait & Station: normal ?Patient leans: N/A ? ? ?[facility] Discharge Disposition for Follow up and Recommendations: ?Based on my evaluation the patient does not appear to have an emergency medical condition and can be discharged with resources and follow up care in outpatient services for Individual Therapy ?Patient reviewed with Dr. Bronwen Betters. ?Follow-up with psychiatry resources provided as needed. ? ?Lenard Lance, FNP ?04/11/2021, 2:10 PM ? ?

## 2021-04-11 NOTE — Discharge Instructions (Addendum)

## 2021-04-11 NOTE — Progress Notes (Signed)
?   04/11/21 1215  ?[facility] Triage Screening (Walk-ins at [facility] only)  ?How Did You Hear About [place]? Other (Comment) ?(Referral by the [facility]  ?What Is the Reason for Your Visit/Call Today? Propes is a 51 year old male come to the [facility] from a referral from [facility]. Patient denied suicidal/homicidal ideations and denied auditory/visual hallucinations. Patient report he needs to be seen for an evaluation so he can get his pain medication which he ran out of last night. Denied depressive symptoms, denied anxiety, and denied substance use. The note states, "Urine Screens show positive for medication not prescribed (Hydrocodone) multiple times. Patient denies use, could you evaluate and treat." Patient denied taking additional medication that does not belong to him.  ?How Long Has This Been Causing You Problems? <Week  ?Have You Recently Had Any Thoughts About Hurting Yourself? No  ?Are You Planning to Commit Suicide/Harm Yourself At This time? No  ?Have you Recently Had Thoughts About Hurting Someone Karolee Ohs? No  ?Are You Planning To Harm Someone At This Time? No  ?Are you currently experiencing any auditory, visual or other hallucinations? No  ?Have You Used Any Alcohol or Drugs in the Past 24 Hours? No  ?Do you have any current medical co-morbidities that require immediate attention? No  ?Clinician description of patient physical appearance/behavior: Patient pleasant and cooperative  ?What Do You Feel Would Help You the Most Today? Medication(s)  ?If access to [facility] was not available, would you have sought care in the Emergency Department? No  ?Determination of Need Routine (7 days)  ?Options For Referral Medication Management  ? ? ?

## 2021-04-28 ENCOUNTER — Telehealth (HOSPITAL_COMMUNITY): Payer: Self-pay | Admitting: Internal Medicine

## 2021-04-28 NOTE — BH Assessment (Signed)
Care Management - [facility] Follow Up Discharges  ° °Writer attempted to make contact with patient today and was unsuccessful.  Writer left a HIPPA compliant voice message.  ° °Per chart review, patient was provided with outpatient resources. ° °

## 2022-03-23 ENCOUNTER — Ambulatory Visit (INDEPENDENT_AMBULATORY_CARE_PROVIDER_SITE_OTHER): Payer: Medicare HMO

## 2022-03-23 ENCOUNTER — Ambulatory Visit (INDEPENDENT_AMBULATORY_CARE_PROVIDER_SITE_OTHER): Payer: Medicare HMO | Admitting: Podiatry

## 2022-03-23 ENCOUNTER — Encounter: Payer: Self-pay | Admitting: Podiatry

## 2022-03-23 DIAGNOSIS — M779 Enthesopathy, unspecified: Secondary | ICD-10-CM | POA: Diagnosis not present

## 2022-03-23 DIAGNOSIS — M7752 Other enthesopathy of left foot: Secondary | ICD-10-CM

## 2022-03-23 MED ORDER — TRIAMCINOLONE ACETONIDE 10 MG/ML IJ SUSP
10.0000 mg | Freq: Once | INTRAMUSCULAR | Status: AC
  Administered 2022-03-23: 10 mg

## 2022-03-23 NOTE — Progress Notes (Signed)
Subjective:   Patient ID: Bryan Jordan, male   DOB: 52 y.o.   MRN: [id number]   HPI Patient presents with pain in the big toe joint of the left foot x 1 year.  Does not remember specific injury did work a serious weightbearing job and may put increased stress on it and states it is at times tingling and it gets sore with any kind of pressure.  Has not tried any current treatments he does smoke moderate amount and tries to be active   Review of Systems  All other systems reviewed and are negative.       Objective:  Physical Exam Vitals and nursing note reviewed.  Constitutional:      Appearance: He is well-developed.  Pulmonary:     Effort: Pulmonary effort is normal.  Musculoskeletal:        General: Normal range of motion.  Skin:    General: Skin is warm.  Neurological:     Mental Status: He is alert.     Neurovascular status intact muscle strength found to be adequate range of motion adequate inflammation pain around the first MPJ left foot with fluid buildup around the joint surface pain with movement but no crepitus to the joint or loss of joint range of motion.  Good digital perfusion well-oriented x 3     Assessment:  Inflammatory capsulitis first MPJ left fluid buildup around the joint surface     Plan:  H&P x-rays reviewed and I went ahead today did sterile prep and injected around the joint surface 3 mg Kenalog 5 mg Xylocaine advised on rigid bottom shoes and will be seen back as needed.  May require surgery I educated him on surgery but at this point I am relatively optimistic we can keep it under control conservatively  X-rays were negative for signs of bony spur formation or indications of significant structural arthritic process left first MPJ

## 2022-07-01 ENCOUNTER — Ambulatory Visit (INDEPENDENT_AMBULATORY_CARE_PROVIDER_SITE_OTHER): Payer: Medicare HMO | Admitting: Podiatry

## 2022-07-01 ENCOUNTER — Encounter: Payer: Self-pay | Admitting: Podiatry

## 2022-07-01 DIAGNOSIS — M7752 Other enthesopathy of left foot: Secondary | ICD-10-CM

## 2022-07-01 MED ORDER — TRIAMCINOLONE ACETONIDE 10 MG/ML IJ SUSP
10.0000 mg | Freq: Once | INTRAMUSCULAR | Status: AC
Start: 2022-07-01 — End: 2022-07-01
  Administered 2022-07-01: 10 mg

## 2022-07-01 NOTE — Progress Notes (Signed)
Subjective:   Patient ID: Bryan Jordan, male   DOB: 52 y.o.   MRN: [id number]   HPI Patient presents stating the big toe joints of started and have actually been very sore   Patient states they both get very tender the left has more enlargement than the right and he still has some discomfort outside of her right foot not to the same degree   ROS      Objective:  Physical Exam  Neurovascular status intact with inflammation mostly around the first MPJ left foot with moderate enlargement of the joint surface     Assessment:  Inflammatory capsulitis first MPJ left with probability of some bone issues associated with this     Plan:  H&P reviewed sterile prep injected around the first MPJ and periarticular fashion 3 mg Dexasone Kenalog 5 mg Xylocaine discussed importance of rigid bottom shoes and patient will be seen back as symptoms indicate may require osteotomy and shortening procedure first metatarsal left

## 2022-07-23 ENCOUNTER — Ambulatory Visit (INDEPENDENT_AMBULATORY_CARE_PROVIDER_SITE_OTHER): Payer: Medicare HMO | Admitting: Licensed Clinical Social Worker

## 2022-07-23 DIAGNOSIS — F1190 Opioid use, unspecified, uncomplicated: Secondary | ICD-10-CM

## 2022-07-23 DIAGNOSIS — Z719 Counseling, unspecified: Secondary | ICD-10-CM

## 2022-07-23 DIAGNOSIS — Z0389 Encounter for observation for other suspected diseases and conditions ruled out: Secondary | ICD-10-CM
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
## 2023-11-08 IMAGING — DX DG LUMBAR SPINE 2-3V
2 series · 2 of 2 positions shown · non-contrast
Comparison: None.

CLINICAL DATA: Chronic low back pain radiating down both legs x
several years.

EXAM:
LUMBAR SPINE - 2-3 VIEW

[view not recorded (1 of 2)]
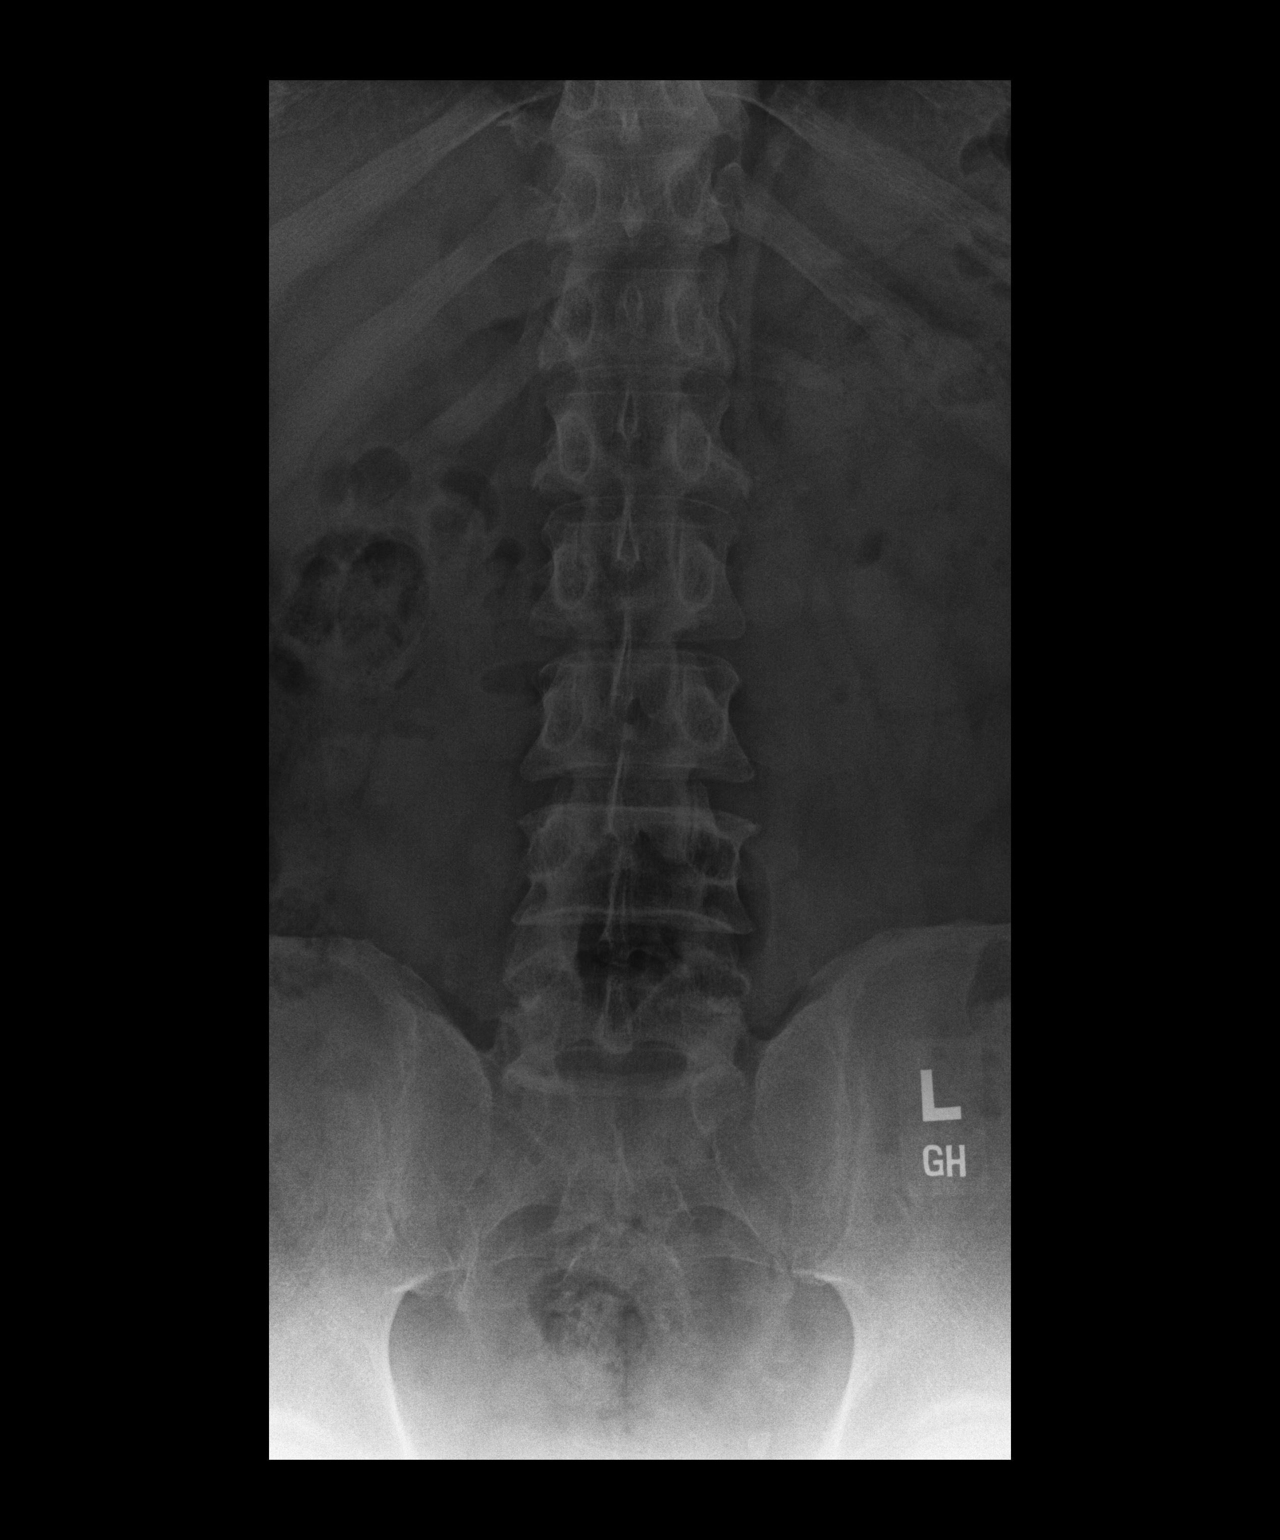

[view not recorded (2 of 2)]
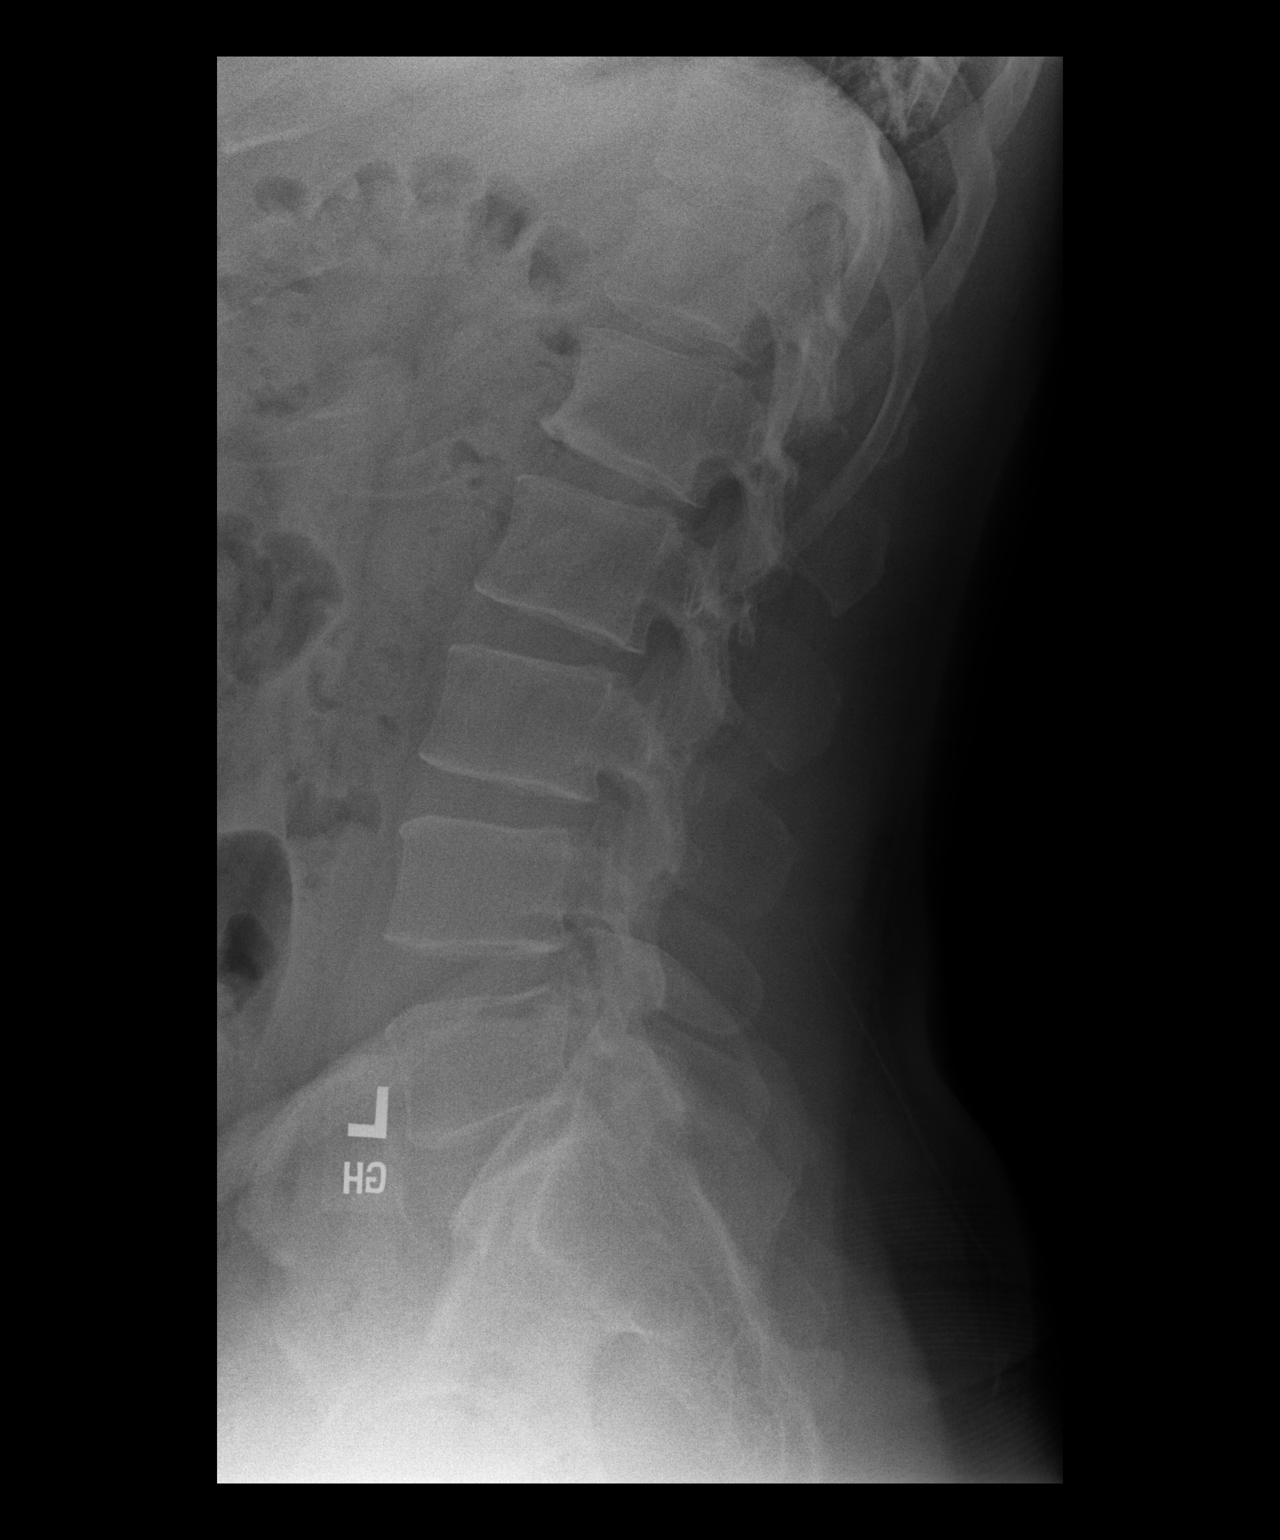

[2 of 2 positions shown; findings below may reference images not displayed]

FINDINGS: There are 5 non-rib-bearing lumbar-type vertebral bodies. Normal
frontal and sagittal alignment. Minimal anterior inferior L1
vertebral body height loss with chronic inferior endplate sclerosis
and anterior superior and inferior L1 endplate degenerative
spurring. No significant disc space narrowing. The bilateral
sacroiliac joint spaces are maintained.
IMPRESSION: Minimal anterior inferior L1 vertebral body height loss is
technically age indeterminate, however there are chronic appearing
changes including inferior endplate sclerosis that suggests this may
be chronic.
